# Patient Record
Sex: Male | Born: 1980 | Race: White | Hispanic: No | Marital: Single | Smoking: Never smoker
Health system: Southern US, Community
[De-identification: ages and names within clinical notes are randomized; demographics above are authoritative.]

## PROBLEM LIST (undated history)

## (undated) DIAGNOSIS — J45909 Unspecified asthma, uncomplicated: Secondary | ICD-10-CM

## (undated) DIAGNOSIS — F909 Attention-deficit hyperactivity disorder, unspecified type: Secondary | ICD-10-CM

## (undated) DIAGNOSIS — N433 Hydrocele, unspecified: Secondary | ICD-10-CM

## (undated) DIAGNOSIS — F329 Major depressive disorder, single episode, unspecified: Secondary | ICD-10-CM

## (undated) DIAGNOSIS — Z87820 Personal history of traumatic brain injury: Secondary | ICD-10-CM

## (undated) DIAGNOSIS — F32A Depression, unspecified: Secondary | ICD-10-CM

---
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
## 2009-01-02 ENCOUNTER — Encounter: Admission: RE | Admit: 2009-01-02 | Discharge: 2009-01-02 | Payer: Self-pay | Admitting: Family Medicine

## 2009-11-29 IMAGING — US US SCROTUM
1 series · 14 of 25 positions shown · non-contrast
Comparison: None

CLINICAL DATA: Enlarged right scrotum

ULTRASOUND OF SCROTUM
TECHNIQUE: Complete ultrasound examination of the testicles,
epididymis, and other scrotal structures was performed.

[Series 1: us scrotum · 0.09mm/px · 14 of 61 slices shown]
[im 1/61]
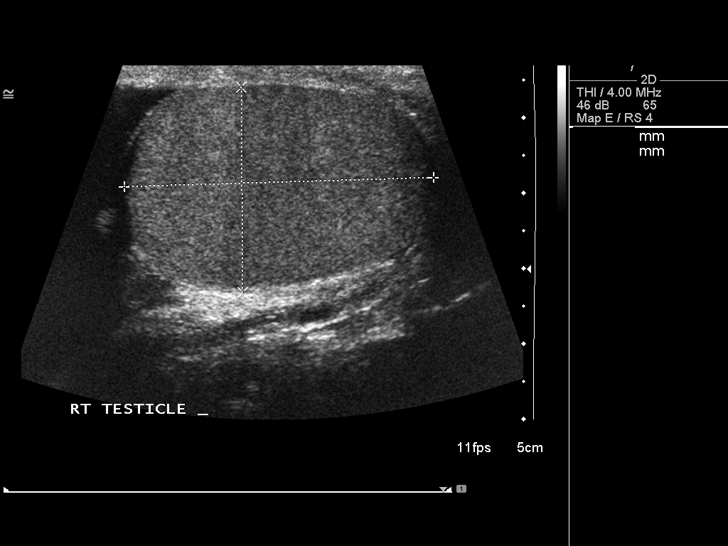
[im 6/61]
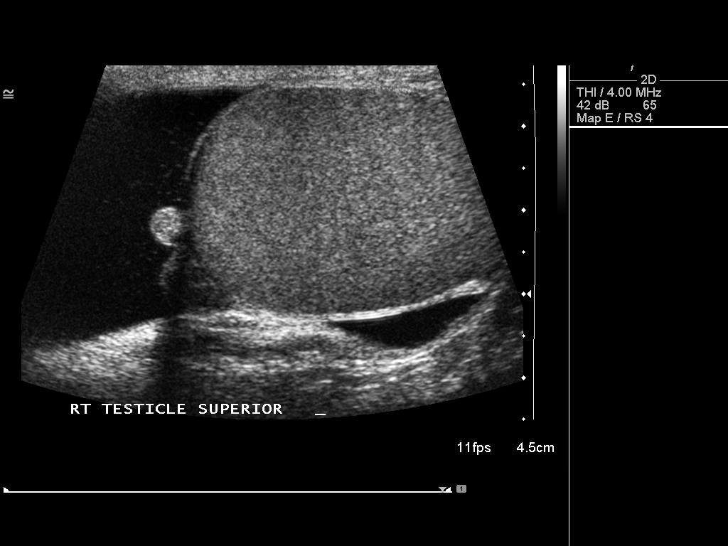
[im 11/61]
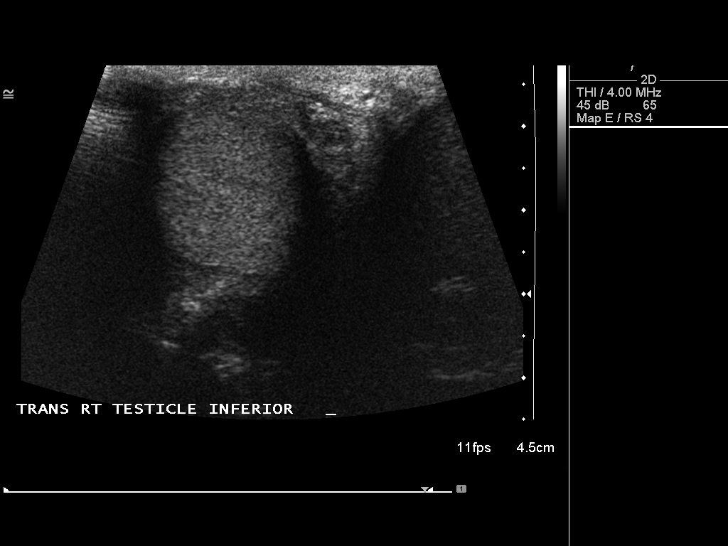
[im 16/61]
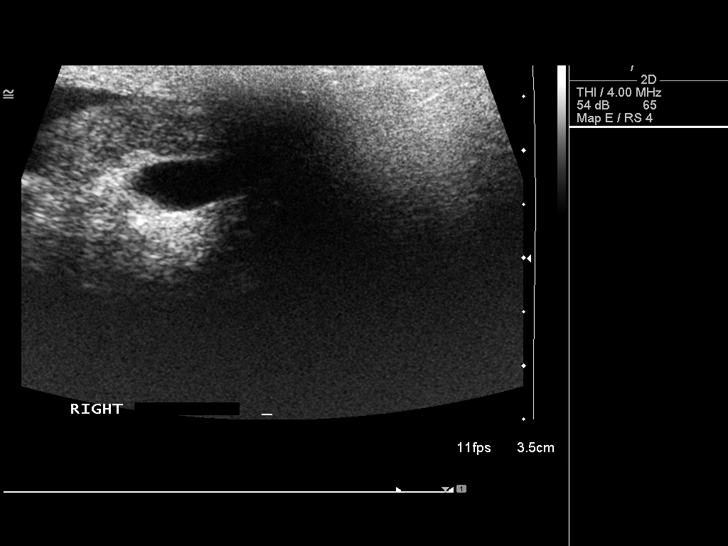
[im 21/61]
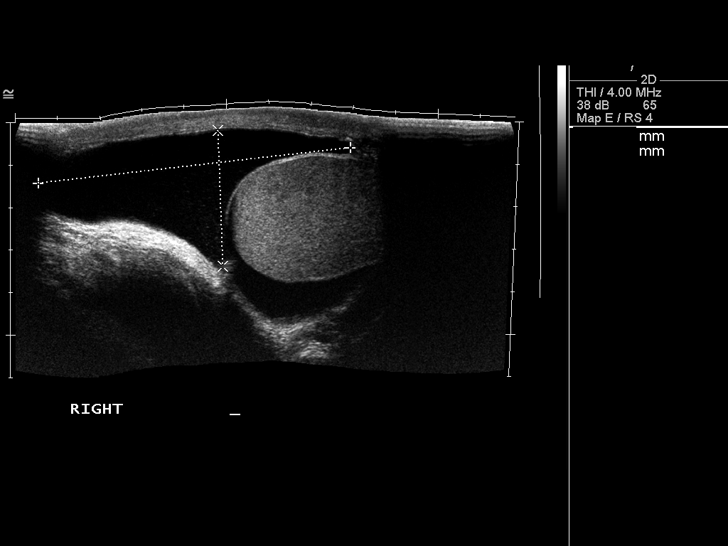
[im 23/61]
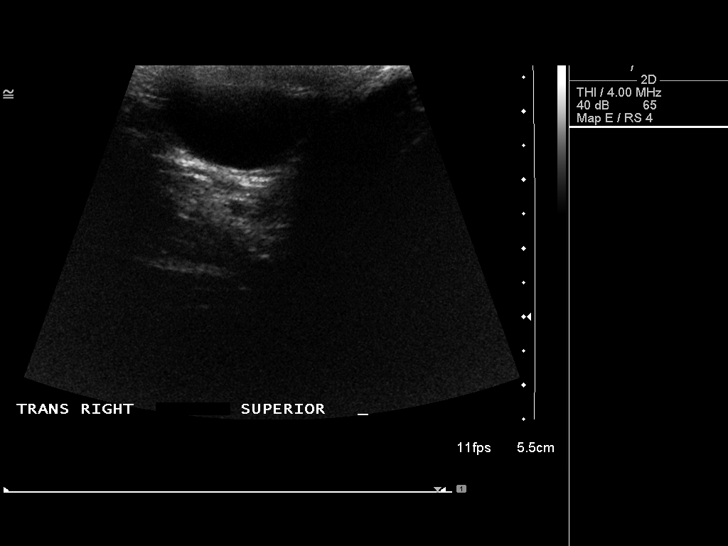
[im 28/61]
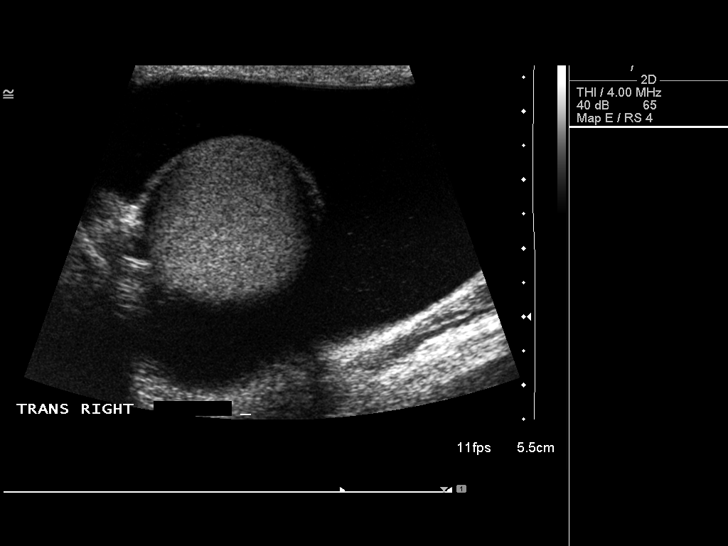
[im 33/61]
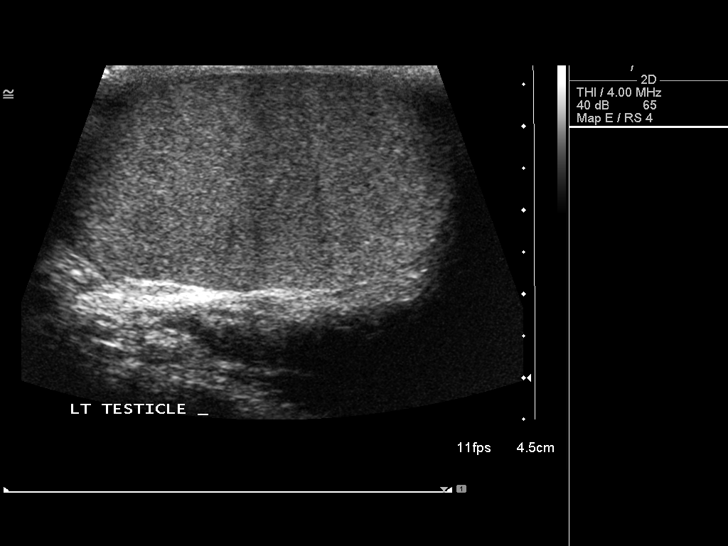
[im 38/61]
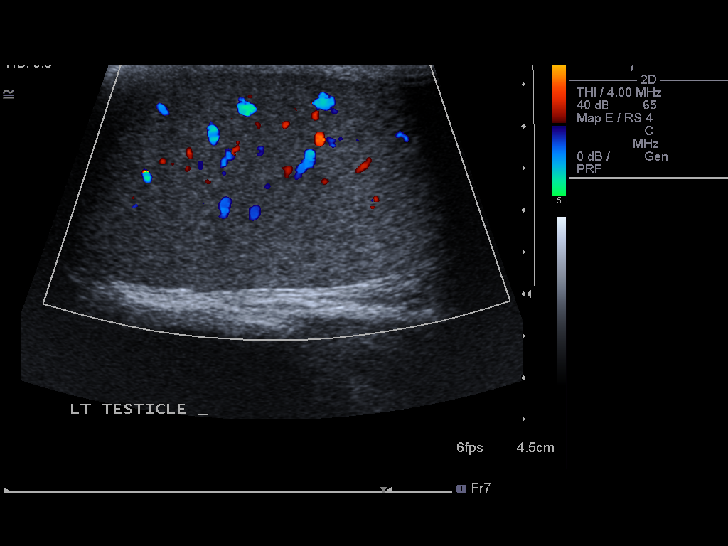
[im 41/61]
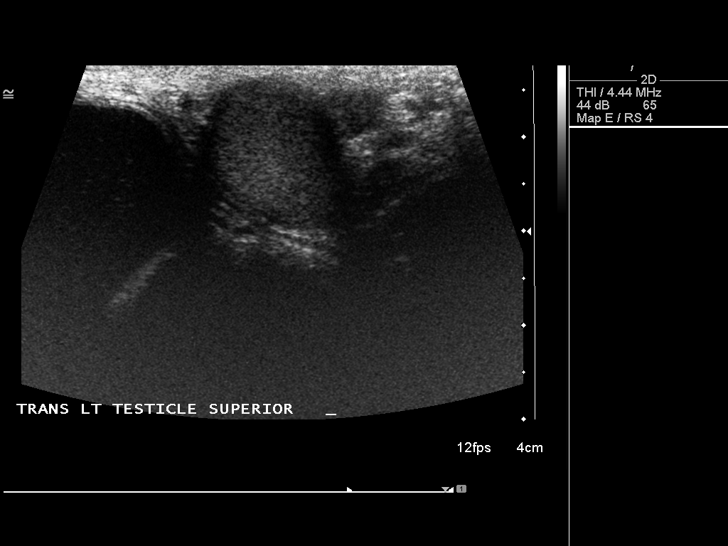
[im 46/61]
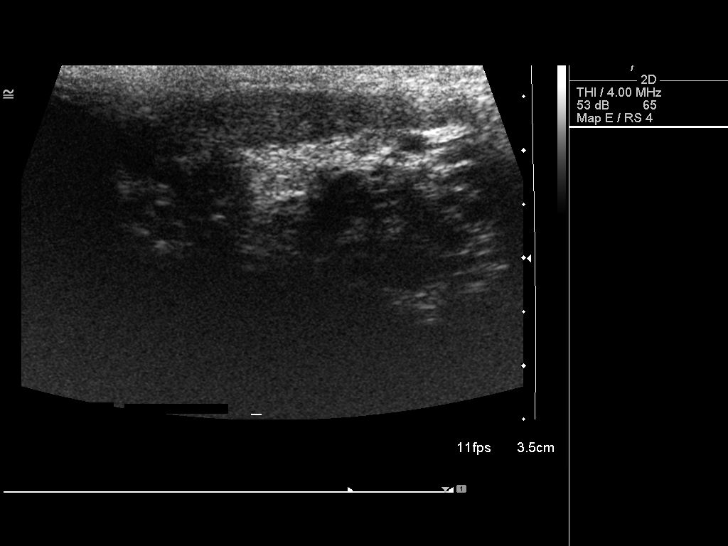
[im 51/61]
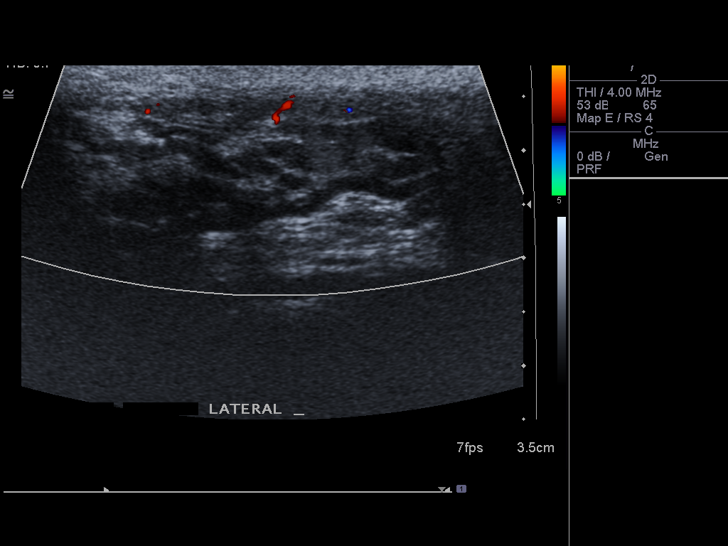
[im 56/61]
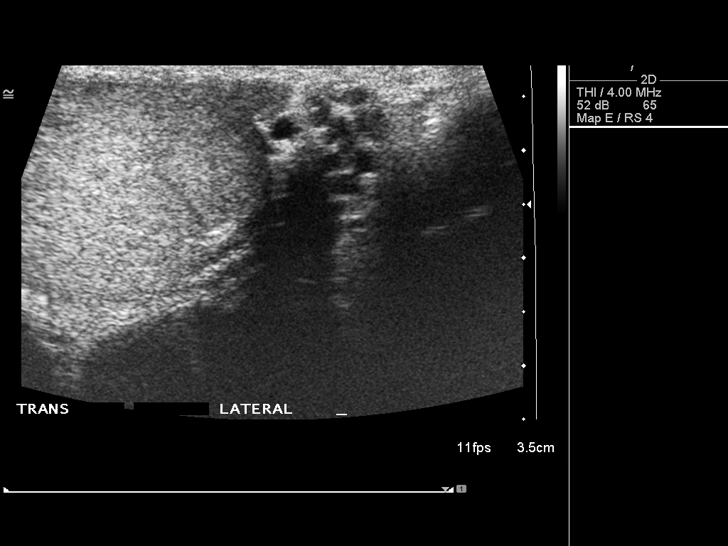
[im 61/61]
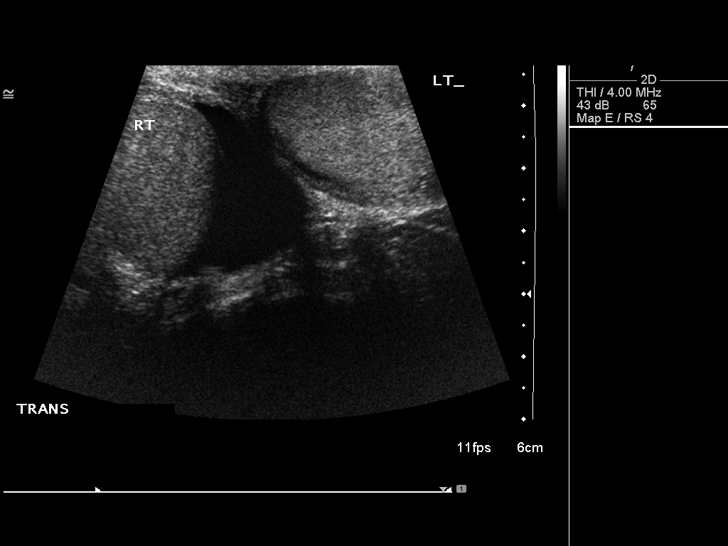

[14 of 25 positions shown; findings below may reference images not displayed]

FINDINGS: The testicles are normal in size and in echogenicity.
There is blood flow to both testicles.  The epididymis appears
normal bilaterally.  There is a moderate to large right hydrocele
present.  A small varicocele is noted on the left which does
augment with Valsalva maneuver.
IMPRESSION: 1.  Moderate to large right hydrocele.
2.  There is blood flow to both testicles.
3.  Small left varicocele.

## 2011-04-12 ENCOUNTER — Emergency Department (HOSPITAL_COMMUNITY): Payer: PRIVATE HEALTH INSURANCE

## 2011-04-12 ENCOUNTER — Emergency Department (HOSPITAL_COMMUNITY)
Admission: EM | Admit: 2011-04-12 | Discharge: 2011-04-12 | Disposition: A | Discharge status: Home / Self Care | Payer: PRIVATE HEALTH INSURANCE | Attending: Emergency Medicine | Admitting: Emergency Medicine

## 2011-04-12 DIAGNOSIS — I498 Other specified cardiac arrhythmias: Secondary | ICD-10-CM | POA: Insufficient documentation

## 2011-04-12 DIAGNOSIS — R4789 Other speech disturbances: Secondary | ICD-10-CM | POA: Insufficient documentation

## 2011-04-12 DIAGNOSIS — R55 Syncope and collapse: Secondary | ICD-10-CM | POA: Insufficient documentation

## 2011-04-12 DIAGNOSIS — J45909 Unspecified asthma, uncomplicated: Secondary | ICD-10-CM | POA: Insufficient documentation

## 2011-04-12 DIAGNOSIS — R42 Dizziness and giddiness: Secondary | ICD-10-CM | POA: Insufficient documentation

## 2011-04-12 DIAGNOSIS — R51 Headache: Secondary | ICD-10-CM | POA: Insufficient documentation

## 2011-04-12 DIAGNOSIS — F29 Unspecified psychosis not due to a substance or known physiological condition: Secondary | ICD-10-CM | POA: Insufficient documentation

## 2011-04-12 DIAGNOSIS — R5381 Other malaise: Secondary | ICD-10-CM | POA: Insufficient documentation

## 2011-04-12 DIAGNOSIS — F988 Other specified behavioral and emotional disorders with onset usually occurring in childhood and adolescence: Secondary | ICD-10-CM | POA: Insufficient documentation

## 2011-04-12 DIAGNOSIS — Z79899 Other long term (current) drug therapy: Secondary | ICD-10-CM | POA: Insufficient documentation

## 2011-04-12 DIAGNOSIS — H538 Other visual disturbances: Secondary | ICD-10-CM | POA: Insufficient documentation

## 2011-04-12 DIAGNOSIS — R209 Unspecified disturbances of skin sensation: Secondary | ICD-10-CM | POA: Insufficient documentation

## 2011-04-12 LAB — CBC
HCT: 41.8 % (ref 39.0–52.0)
Hemoglobin: 15.1 g/dL (ref 13.0–17.0)
MCH: 31.6 pg (ref 26.0–34.0)
MCHC: 36.1 g/dL — ABNORMAL HIGH (ref 30.0–36.0)
MCV: 87.4 fL (ref 78.0–100.0)
Platelets: 195 10*3/uL (ref 150–400)
RBC: 4.78 MIL/uL (ref 4.22–5.81)
RDW: 12.6 % (ref 11.5–15.5)
WBC: 7 10*3/uL (ref 4.0–10.5)

## 2011-04-12 LAB — BASIC METABOLIC PANEL
BUN: 10 mg/dL (ref 6–23)
CO2: 26 mEq/L (ref 19–32)
Calcium: 9.1 mg/dL (ref 8.4–10.5)
Chloride: 105 mEq/L (ref 96–112)
Creatinine, Ser: 0.91 mg/dL (ref 0.4–1.5)
GFR calc Af Amer: >60 mL/min (ref >60)
GFR calc non Af Amer: >60 mL/min (ref >60)
Glucose, Bld: 119 mg/dL — ABNORMAL HIGH (ref 70–99)
Potassium: 3.7 mEq/L (ref 3.5–5.1)
Sodium: 138 mEq/L (ref 135–145)

## 2011-04-12 LAB — DIFFERENTIAL
Basophils Absolute: 0 10*3/uL (ref 0.0–0.1)
Basophils Relative: 0 % (ref 0–1)
Eosinophils Absolute: 0.1 10*3/uL (ref 0.0–0.7)
Eosinophils Relative: 1 % (ref 0–5)
Lymphocytes Relative: 20 % (ref 12–46)
Lymphs Abs: 1.4 10*3/uL (ref 0.7–4.0)
Monocytes Absolute: 0.4 10*3/uL (ref 0.1–1.0)
Monocytes Relative: 6 % (ref 3–12)
Neutro Abs: 5.1 10*3/uL (ref 1.7–7.7)
Neutrophils Relative %: 73 % (ref 43–77)

## 2011-04-30 NOTE — H&P (Signed)
[facility]  Patient:    Brent Frey, Brent Frey                          MRN: [id number] Adm. Date:  [id number] Disc. Date: [id number] Attending:  Carolanne Grumbling D                   Psychiatric Admission Assessment  CHIEF COMPLAINT:  Patient was admitted to the hospital after taking an overdose of eight Advil.  His blood alcohol level was 236 with a normal high of 10 when he was in the emergency room.  PATIENT IDENTIFICATION:  Patient is an 30 year old male.  HISTORY OF PRESENT ILLNESS:  Patient, at this point, says he just needs to sleep.  He does not really recall all the details in what happened last night. He denies any current suicidal ideation.  His parents are accompanying him and said they were unaware of any particular problems that he was having, that he seemed happy.  He had a good life.  He is around them a lot, so they do see a good bit of him.  They saw him last night when he came in.  He seemed fine and they were surprised when his girlfriend called in the middle of the night saying that he said he had taken an overdose.  PAST PSYCHIATRIC HISTORY:  He has been somewhat depressed recently in that he reported to his mother that he felt depressed.  So he has been seeing Dr. Wyn Quaker for the last few weeks and seems to be fine.  There is a family history of bipolar disorder in the maternal grandfather and his mother reported she had not noticed any bipolar symptoms but he does have mood swings to some extent but not to any great degree.  SUBSTANCE ABUSE HISTORY:  He does drink on the weekends.  It is not clear how much he drinks but obviously the night of admission he had a good bit to drink.  His parents say that even with that much to drink, he was able to walk around and not seem to have slurred speech, was not staggering and looked fine to them; just sleepy.  He denies any other substances; at least currently.  PAST MEDICAL HISTORY:  He is allergic to  SULFA drugs.  He is not taking any medications.  Has no medical problems.  SOCIAL HISTORY:  He lives with his mother, father and his younger brother.  He gets along fine with them.  They have a good relationship with each other. School is good.  Grades are fine.  He plays varsity sports.  Has friends. Seems happy.  Has no history of any abuse; physical or sexual.  He has no legal problems.  MENTAL STATUS EXAMINATION:  At the time of initial evaluation, revealed a sleepy, oriented young man but said he really could not think straight.  He has been up all night and said he needed sleep.  He denied any current suicidal ideation and said he could not really say what was happening last night, though he did remember taking the overdose.  There was no evidence of any psychotic thinking.  Memory was not judgeable because of his unwillingness to talk and answer questions.  He is, by history, at least average intelligence.  He has a history of poor concentration.  ADMISSION DIAGNOSES: Axis I:    1. Depressive disorder not otherwise specified.  2. Alcohol abuse.            3. Attention-deficit disorder. Axis II:   Deferred. Axis III:  Healthy. Axis IV:   Mild. Axis V:    45/85.  ASSETS AND STRENGTHS:  Patient is intelligent.  He has a supportive family.  INITIAL PLAN OF CARE:  See what he is like when he is alert and sober and then decide what is indicated from that point on.  ESTIMATED LENGTH OF STAY:  One to three days. DD:  04/24/00 TD:  04/26/00 Job: [id number]

## 2011-04-30 NOTE — Discharge Summary (Signed)
[facility]  Patient:    Brent Frey, Brent Frey                          MRN: [id number] Adm. Date:  [id number] Disc. Date: [id number] Attending:  Carolanne Grumbling D                           Discharge Summary  IDENTIFICATION:  Brent Frey was an 30 year old male.  INITIAL ASSESSMENT AND DIAGNOSIS:  Brent Frey was admitted to the hospital after taking an overdose of eight Advil tablets.  His blood alcohol level was 236 with a normal high of 10.  At the time I saw him he said just needed to sleep. He did not recall the details of what happened the night before.  He denied any current suicidal ideation.  His parents who were with him said they were unaware of any particular problems that he was having.  He seemed fine even at night.  He did not seem drunk to them when he came home.  They were surprised that in the middle of the night his girlfriend called and told them of the overdose.  MENTAL STATUS:  Mental status at the time of the initial evaluation revealed a sleepy young man who did not want to talk.  He basically denied any suicidal ideation.  He said all he wanted to do was go to sleep.  He was making no threats towards anyone else.  By history he is at least average intelligence. He had a history of poor concentration.  Memory was not measurable because of his difficulty cooperating.  No further history was obtained by the psychosocial service summary because of his short stay.  PHYSICAL EXAMINATION:  Noncontributory.  ADMITTING DIAGNOSES: Axis I:    1. Depressive disorder, not otherwise specified.            2. Alcohol abuse.            3. Attention deficit disorder. Axis II:   Deferred. Axis III:  Healthy. Axis IV:   Mild. Axis V:    45/85.  LABORATORY FINDINGS:  All indicated laboratory examinations were done prior to his admission via the emergency service.  HOSPITAL COURSE:  While in the hospital Brent Frey was no behavioral problem.  Once he had a nights sleep he  was very appropriate.  He was feeling sheepish about taking the overdose.  He said there is really no thing in his life at the moment that is a problem.  School is going well.  His friends and relationships are going okay.  He has a girlfriend and things are good there. His relationship with his parents is good.  He is looking forward to summer. He says all the things he had done to get himself in trouble like this are done when he is drinking.  He does drink to the point where he cannot remember what happened the day before at times and once he starts drinking sometimes, he has trouble stopping.  Consequently, his alcoholism seemed to be the major issue at the moment, and his parents in meeting with him decided that he would continue in outpatient therapy talking about his issues, but if he could not get the drinking under control, or stop it completely which was preferable, they would look for a drug rehab for him.  CONDITION AT THE TIME OF DISCHARGE:  He was denying any threats towards himself  or anyone else.  His parents were comfortable having him home and he was consequently discharged home.  POST HOSPITAL CARE PLANS:  He will see Dr. Eliott Nine on May 15 and he will see me on June 11.  At the time of discharge he was taking _________ 20 mg twice daily.  ACTIVITY/DIET:  No restrictions were placed on his activity or his diet.  FINAL DIAGNOSES: Axis I:    1. Depressive disorder, not otherwise specified.            2. Alcohol abuse.            3. Attention deficit disorder. Axis II:   No diagnosis. Axis III:  Healthy. Axis IV:   Mild. Axis V:    50.DD:  05/04/00 TD:  05/04/00 Job: [id number]

## 2013-10-16 ENCOUNTER — Other Ambulatory Visit: Payer: Self-pay | Admitting: Urology

## 2013-11-16 ENCOUNTER — Encounter (HOSPITAL_BASED_OUTPATIENT_CLINIC_OR_DEPARTMENT_OTHER): Payer: Self-pay | Admitting: *Deleted

## 2013-11-16 NOTE — Progress Notes (Signed)
NPO AFTER MN. ARRIVE AT 0930. NEEDS HG. WILL TAKE SINGULAIR AND CLARITIN , DO ADVAIR INHALER AM DOS W/ SIPS OF WATER.

## 2013-11-19 NOTE — H&P (Signed)
History of Present Illness   Brent Frey has stable frequency as noted.  He has a right-sided hydrocele. He had a scrotal ultrasound to make certain the right testicle was normal. The right testicle is 3.81 cm x 2.9 cm x 3.29 cm. Left testicle was 4.2 cm x 2.4 cm x 3.2 cm. Epididymis was normal. He had a hydrocele on the right, approximately 8 x 3 cm. Blood supply was good bilaterally. There was intratesticular masses. The testicles are homogenous.  Review of systems: No change in bowel or neurologic systems.    Past Medical History Problems  1. History of Anxiety (300.00) 2. History of Asthma (493.90) 3. History of Asthma (493.90)  Surgical History Problems  1. History of No Surgical Problems 2. History of No Surgical Problems  Current Meds 1. Adderall TABS;  Therapy: (Recorded:28Apr2010) to Recorded 2. Advair Diskus MISC;  Therapy: (Recorded:28Apr2010) to Recorded 3. Flexeril 10 MG TABS;  Therapy: (Recorded:13Oct2014) to Recorded 4. KlonoPIN 1 MG Oral Tablet;  Therapy: (Recorded:13Oct2014) to Recorded 5. Singulair TABS;  Therapy: (Recorded:28Apr2010) to Recorded  Allergies Medication  1. Sulfa Drugs  Social History Problems  1. Alcohol Use   2 drinks daily 2. Caffeine Use   0-1 drinks daily 3. Marital History - Single 4. Never A Smoker 5. Occupation:   Airline pilot 6. Denied: History of Tobacco Use  Results/Data  Urine [Data Includes: Last 1 Day]   29Oct2014  COLOR STRAW   APPEARANCE CLEAR   SPECIFIC GRAVITY <1.005   pH 5.5   GLUCOSE NEG mg/dL  BILIRUBIN NEG   KETONE NEG mg/dL  BLOOD SMALL   PROTEIN NEG mg/dL  UROBILINOGEN 0.2 mg/dL  NITRITE NEG   LEUKOCYTE ESTERASE NEG   SQUAMOUS EPITHELIAL/HPF NONE SEEN   WBC 0-2 WBC/hpf  RBC 0-2 RBC/hpf  BACTERIA NONE SEEN   CRYSTALS NONE SEEN   CASTS NONE SEEN    Assessment Assessed  1. Hydrocele (603.9) 2. Increased urinary frequency (788.41)  End of Encounter Meds  Medication Name Instruction  Adderall  TABS (Amphetamine-Dextroamphetamine)   Advair Diskus MISC   Flexeril 10 MG TABS (Cyclobenzaprine HCl)   KlonoPIN 1 MG Oral Tablet (ClonazePAM)   Singulair TABS (Montelukast Sodium)    Plan Hydrocele  1. Follow-up Schedule Surgery Office  Follow-up  Status: Complete  Done: 29Oct2014  Discussion/Summary   Brent Frey would like to proceed with a right hydrocelectomy. All questions answered.  After a thorough review of the management options for the patient's condition the patient  elected to proceed with surgical therapy as noted above. We have discussed the potential benefits and risks of the procedure, side effects of the proposed treatment, the likelihood of the patient achieving the goals of the procedure, and any potential problems that might occur during the procedure or recuperation. Informed consent has been obtained.

## 2013-11-20 ENCOUNTER — Ambulatory Visit (HOSPITAL_BASED_OUTPATIENT_CLINIC_OR_DEPARTMENT_OTHER): Payer: BC Managed Care – PPO | Admitting: Anesthesiology

## 2013-11-20 ENCOUNTER — Encounter (HOSPITAL_BASED_OUTPATIENT_CLINIC_OR_DEPARTMENT_OTHER): Payer: BC Managed Care – PPO | Admitting: Anesthesiology

## 2013-11-20 ENCOUNTER — Encounter (HOSPITAL_BASED_OUTPATIENT_CLINIC_OR_DEPARTMENT_OTHER): Payer: Self-pay | Admitting: Anesthesiology

## 2013-11-20 ENCOUNTER — Encounter (HOSPITAL_BASED_OUTPATIENT_CLINIC_OR_DEPARTMENT_OTHER)
Admission: RE | Disposition: A | Discharge status: Home / Self Care | Payer: Self-pay | Source: Ambulatory Visit | Attending: Urology

## 2013-11-20 ENCOUNTER — Ambulatory Visit (HOSPITAL_BASED_OUTPATIENT_CLINIC_OR_DEPARTMENT_OTHER)
Admission: RE | Admit: 2013-11-20 | Discharge: 2013-11-20 | Disposition: A | Discharge status: Home / Self Care | Payer: BC Managed Care – PPO | Source: Ambulatory Visit | Attending: Urology | Admitting: Urology

## 2013-11-20 DIAGNOSIS — J45909 Unspecified asthma, uncomplicated: Secondary | ICD-10-CM | POA: Insufficient documentation

## 2013-11-20 DIAGNOSIS — Z79899 Other long term (current) drug therapy: Secondary | ICD-10-CM | POA: Insufficient documentation

## 2013-11-20 DIAGNOSIS — N433 Hydrocele, unspecified: Secondary | ICD-10-CM | POA: Insufficient documentation

## 2013-11-20 HISTORY — DX: Depression, unspecified: F32.A

## 2013-11-20 HISTORY — DX: Attention-deficit hyperactivity disorder, unspecified type: F90.9

## 2013-11-20 HISTORY — PX: HYDROCELE EXCISION: SHX482

## 2013-11-20 HISTORY — DX: Major depressive disorder, single episode, unspecified: F32.9

## 2013-11-20 HISTORY — DX: Personal history of traumatic brain injury: Z87.820

## 2013-11-20 HISTORY — DX: Unspecified asthma, uncomplicated: J45.909

## 2013-11-20 HISTORY — DX: Hydrocele, unspecified: N43.3

## 2013-11-20 LAB — POCT HEMOGLOBIN-HEMACUE: Hemoglobin: 14 g/dL (ref 13.0–17.0)

## 2013-11-20 SURGERY — HYDROCELECTOMY
Anesthesia: General | Site: Scrotum | Laterality: Right

## 2013-11-20 MED ORDER — LACTATED RINGERS IV SOLN
INTRAVENOUS | Status: DC
  Filled 2013-11-20: qty 1000

## 2013-11-20 MED ORDER — FENTANYL CITRATE 0.05 MG/ML IJ SOLN
INTRAMUSCULAR | Status: DC | PRN
  Administered 2013-11-20: 25 ug via INTRAVENOUS
  Administered 2013-11-20 (×5): 12.5 ug via INTRAVENOUS
  Administered 2013-11-20 (×2): 25 ug via INTRAVENOUS
  Administered 2013-11-20 (×5): 12.5 ug via INTRAVENOUS

## 2013-11-20 MED ORDER — MIDAZOLAM HCL 5 MG/5ML IJ SOLN
INTRAMUSCULAR | Status: DC | PRN
  Administered 2013-11-20 (×2): 1 mg via INTRAVENOUS

## 2013-11-20 MED ORDER — DEXAMETHASONE SODIUM PHOSPHATE 4 MG/ML IJ SOLN
INTRAMUSCULAR | Status: DC | PRN
  Administered 2013-11-20: 10 mg via INTRAVENOUS

## 2013-11-20 MED ORDER — LACTATED RINGERS IV SOLN
INTRAVENOUS | Status: DC | PRN
  Administered 2013-11-20 (×2): via INTRAVENOUS

## 2013-11-20 MED ORDER — CEPHALEXIN 250 MG PO CAPS: 250.0000 mg | ORAL_CAPSULE | Freq: Three times a day (TID) | ORAL | Status: AC

## 2013-11-20 MED ORDER — PROMETHAZINE HCL 25 MG/ML IJ SOLN
6.2500 mg | INTRAMUSCULAR | Status: DC | PRN
  Filled 2013-11-20: qty 1

## 2013-11-20 MED ORDER — ONDANSETRON HCL 4 MG/2ML IJ SOLN
INTRAMUSCULAR | Status: DC | PRN
  Administered 2013-11-20: 4 mg via INTRAVENOUS

## 2013-11-20 MED ORDER — CEFAZOLIN SODIUM-DEXTROSE 2-3 GM-% IV SOLR
INTRAVENOUS | Status: DC | PRN
  Administered 2013-11-20: 2 g via INTRAVENOUS

## 2013-11-20 MED ORDER — MIDAZOLAM HCL 2 MG/2ML IJ SOLN
INTRAMUSCULAR | Status: AC
  Filled 2013-11-20: qty 2

## 2013-11-20 MED ORDER — KETOROLAC TROMETHAMINE 30 MG/ML IJ SOLN
INTRAMUSCULAR | Status: DC | PRN
  Administered 2013-11-20: 30 mg via INTRAVENOUS

## 2013-11-20 MED ORDER — BUPIVACAINE HCL (PF) 0.25 % IJ SOLN
INTRAMUSCULAR | Status: DC | PRN
  Administered 2013-11-20: 2 mL

## 2013-11-20 MED ORDER — OXYCODONE-ACETAMINOPHEN 10-650 MG PO TABS: 1.0000 | ORAL_TABLET | Freq: Four times a day (QID) | ORAL | Status: AC | PRN

## 2013-11-20 MED ORDER — FENTANYL CITRATE 0.05 MG/ML IJ SOLN
INTRAMUSCULAR | Status: AC
  Filled 2013-11-20: qty 4

## 2013-11-20 MED ORDER — OXYCODONE-ACETAMINOPHEN 5-325 MG PO TABS
1.0000 | ORAL_TABLET | ORAL | Status: DC | PRN
  Administered 2013-11-20: 1 via ORAL
  Filled 2013-11-20: qty 1

## 2013-11-20 MED ORDER — HYDROMORPHONE HCL PF 1 MG/ML IJ SOLN
0.2500 mg | INTRAMUSCULAR | Status: DC | PRN
  Filled 2013-11-20: qty 1

## 2013-11-20 MED ORDER — LACTATED RINGERS IV SOLN
INTRAVENOUS | Status: DC
  Administered 2013-11-20: 10:00:00 via INTRAVENOUS
  Filled 2013-11-20: qty 1000

## 2013-11-20 MED ORDER — PROPOFOL 10 MG/ML IV BOLUS
INTRAVENOUS | Status: DC | PRN
  Administered 2013-11-20: 200 mg via INTRAVENOUS

## 2013-11-20 MED ORDER — CEFAZOLIN SODIUM 1-5 GM-% IV SOLN
1.0000 g | INTRAVENOUS | Status: DC
  Filled 2013-11-20: qty 50

## 2013-11-20 MED ORDER — OXYCODONE-ACETAMINOPHEN 5-325 MG PO TABS
ORAL_TABLET | ORAL | Status: AC
  Filled 2013-11-20: qty 1

## 2013-11-20 MED ORDER — LIDOCAINE HCL (CARDIAC) 20 MG/ML IV SOLN
INTRAVENOUS | Status: DC | PRN
  Administered 2013-11-20: 100 mg via INTRAVENOUS

## 2013-11-20 SURGICAL SUPPLY — 42 items
ADH SKN CLS APL DERMABOND .7 (GAUZE/BANDAGES/DRESSINGS) ×1
APPLICATOR COTTON TIP 6IN STRL (MISCELLANEOUS) ×2 IMPLANT
BANDAGE GAUZE ELAST BULKY 4 IN (GAUZE/BANDAGES/DRESSINGS) ×2 IMPLANT
BLADE SURG 15 STRL LF DISP TIS (BLADE) ×1 IMPLANT
BLADE SURG 15 STRL SS (BLADE) ×2
BLADE SURG ROTATE 9660 (MISCELLANEOUS) ×2 IMPLANT
BRIEF STRETCH FOR OB PAD LRG (UNDERPADS AND DIAPERS) ×1 IMPLANT
CANISTER SUCTION 1200CC (MISCELLANEOUS) IMPLANT
CANISTER SUCTION 2500CC (MISCELLANEOUS) IMPLANT
CLOTH BEACON ORANGE TIMEOUT ST (SAFETY) ×2 IMPLANT
COVER MAYO STAND STRL (DRAPES) ×2 IMPLANT
COVER TABLE BACK 60X90 (DRAPES) ×2 IMPLANT
DERMABOND ADVANCED (GAUZE/BANDAGES/DRESSINGS) ×1
DERMABOND ADVANCED .7 DNX12 (GAUZE/BANDAGES/DRESSINGS) IMPLANT
DISSECTOR ROUND CHERRY 3/8 STR (MISCELLANEOUS) IMPLANT
DRAIN PENROSE 18X1/4 LTX STRL (WOUND CARE) IMPLANT
DRAPE PED LAPAROTOMY (DRAPES) ×2 IMPLANT
DRESSING TELFA 8X3 (GAUZE/BANDAGES/DRESSINGS) ×2 IMPLANT
ELECT NDL TIP 2.8 STRL (NEEDLE) ×1 IMPLANT
ELECT NEEDLE TIP 2.8 STRL (NEEDLE) ×2 IMPLANT
ELECT REM PT RETURN 9FT ADLT (ELECTROSURGICAL) ×2
ELECTRODE REM PT RTRN 9FT ADLT (ELECTROSURGICAL) ×1 IMPLANT
GLOVE BIO SURGEON STRL SZ7.5 (GLOVE) ×2 IMPLANT
GLOVE BIOGEL PI IND STRL 7.5 (GLOVE) IMPLANT
GLOVE BIOGEL PI INDICATOR 7.5 (GLOVE) ×2
GOWN W/COTTON TOWEL STD LRG (GOWNS) ×1 IMPLANT
GOWN XL W/COTTON TOWEL STD (GOWNS) ×3 IMPLANT
NEEDLE HYPO 25X1 1.5 SAFETY (NEEDLE) ×2 IMPLANT
NS IRRIG 500ML POUR BTL (IV SOLUTION) ×2 IMPLANT
PACK BASIN DAY SURGERY FS (CUSTOM PROCEDURE TRAY) ×2 IMPLANT
PAD PREP 24X48 CUFFED NSTRL (MISCELLANEOUS) ×2 IMPLANT
PENCIL BUTTON HOLSTER BLD 10FT (ELECTRODE) ×2 IMPLANT
SUPPORT SCROTAL LG STRP (MISCELLANEOUS) ×1 IMPLANT
SUT CHROMIC 3 0 SH 27 (SUTURE) ×7 IMPLANT
SUT MNCRL AB 3-0 PS2 18 (SUTURE) ×1 IMPLANT
SUT SILK 2 0 SH (SUTURE) IMPLANT
SUT VICRYL 4-0 PS2 18IN ABS (SUTURE) ×1 IMPLANT
SYR CONTROL 10ML LL (SYRINGE) ×2 IMPLANT
TOWEL OR 17X24 6PK STRL BLUE (TOWEL DISPOSABLE) ×4 IMPLANT
TRAY DSU PREP LF (CUSTOM PROCEDURE TRAY) ×2 IMPLANT
WATER STERILE IRR 500ML POUR (IV SOLUTION) ×1 IMPLANT
YANKAUER SUCT BULB TIP NO VENT (SUCTIONS) ×2 IMPLANT

## 2013-11-20 NOTE — Anesthesia Procedure Notes (Addendum)
Procedure Name: LMA Insertion Date/Time: 11/20/2013 10:57 AM Performed by: Jessica Priest Pre-anesthesia Checklist: Patient identified, Emergency Drugs available, Suction available and Patient being monitored Patient Re-evaluated:Patient Re-evaluated prior to inductionOxygen Delivery Method: Circle System Utilized Preoxygenation: Pre-oxygenation with 100% oxygen Intubation Type: IV induction Ventilation: Mask ventilation without difficulty LMA: LMA inserted LMA Size: 4.0 Number of attempts: 1 Airway Equipment and Method: bite block Placement Confirmation: positive ETCO2 Tube secured with: Tape Dental Injury: Teeth and Oropharynx as per pre-operative assessment

## 2013-11-20 NOTE — Transfer of Care (Signed)
Immediate Anesthesia Transfer of Care Note  Patient: Brent Frey  Procedure(s) Performed: Procedure(s) (LRB): REPAIR RIGHT HYDROCELE (Right)  Patient Location: PACU  Anesthesia Type: General  Level of Consciousness: awake, sedated, patient cooperative and responds to stimulation  Airway & Oxygen Therapy: Patient Spontanous Breathing and Patient connected to face mask oxygen  Post-op Assessment: Report given to PACU RN, Post -op Vital signs reviewed and stable and Patient moving all extremities  Post vital signs: Reviewed and stable  Complications: No apparent anesthesia complications

## 2013-11-20 NOTE — Op Note (Signed)
Preoperative diagnosis: Right hydrocele Postoperative diagnosis: Right hydrocele Surgery: Repair of right hydrocele Surgeon: Dr. Lorin Picket Neveyah Garzon  The patient has the above diagnoses and consented to the above procedure. Extra care was taken with skin preparation and shaving. Right arm was marked. Preoperative antibiotics were given.  After prepping the patient a 4 cm mid scrotal incision on the right side in a transverse orientation was marked. The nurse helped with holding of the hydrocele with upward pressure. The incision lengthened relatively. With a scalpel blade I incised the skin and went through the thin dartos fascia. I mobilized the dartos fascia with sharp dissection easily. I easily delivered the hydrocele. With a moist Ray-Tec I swept soft tissue away from the hydrocele.  Away from the testicle the hydrocele was opened 2 cm and emptied. It was then opened widely. There was almost no bleeding or inflammation of the opened Hydrocele or other soft tissue. The hydrocele wall was placed around the back of the cord with instruments placing it in appropriate position. I did a loose r-eapproximation of the hydrocele sac. It went very well. There wss no tension on the cord or compression on the cord. There was no bleeding. Orientation of the testicle was always insured.  Careful inspection for bleeding was performed. Tanja Port was placed on the dartos fascia and skin at the incision apices bilaterally. Testicle was placed in right hemiscrotum. The lie was excellent. One superficial 3-0 chromic was used at 6:00 to tack the testicle to the soft tissue. Again there was no bleeding. Irrigation was utilized.  Between the 2 [place] I closed the dartos with running 3-0 chromic suture. I then closed the skin with interrupted 3-0 chromic. I did easily palpate a soft testicle on both sides. Lie of testicle was normal bilaterally. Dermabond and pressure dressing was applied. The nurses pointed out two blanched  less than 1 cm scrotal areas away from the closed incision that in my opinion were normal findings and not due to any cauterization or trauma during skin preparation or surgery.  I was very pleased with the surgery and hopefully it reaches the patient's treatment goal

## 2013-11-20 NOTE — Anesthesia Postprocedure Evaluation (Signed)
Anesthesia Post Note  Patient: Brent Frey  Procedure(s) Performed: Procedure(s) (LRB): REPAIR RIGHT HYDROCELE (Right)  Anesthesia type: General  Patient location: PACU  Post pain: Pain level controlled  Post assessment: Post-op Vital signs reviewed  Last Vitals:  Filed Vitals:   11/20/13 1200  BP: 129/63  Pulse: 76  Temp:   Resp: 17    Post vital signs: Reviewed  Level of consciousness: sedated  Complications: No apparent anesthesia complications

## 2013-11-20 NOTE — Interval H&P Note (Signed)
History and Physical Interval Note:  11/20/2013 7:12 AM  Brent Frey  has presented today for surgery, with the diagnosis of RIGHT HYDROCELE   The various methods of treatment have been discussed with the patient and family. After consideration of risks, benefits and other options for treatment, the patient has consented to  Procedure(s): REPAIR RIGHT HYDROCELE (Right) as a surgical intervention .  The patient's history has been reviewed, patient examined, no change in status, stable for surgery.  I have reviewed the patient's chart and labs.  Questions were answered to the patient's satisfaction.     Jeananne Bedwell A

## 2013-11-20 NOTE — Anesthesia Preprocedure Evaluation (Addendum)
Anesthesia Evaluation  Patient identified by MRN, date of birth, ID band Patient awake    Reviewed: Allergy & Precautions, H&P , NPO status , Patient's Chart, lab work & pertinent test results  Airway Mallampati: II TM Distance: >3 FB Neck ROM: Full    Dental  (+) Teeth Intact and Dental Advisory Given   Pulmonary asthma ,  breath sounds clear to auscultation  Pulmonary exam normal       Cardiovascular negative cardio ROS  Rhythm:Regular Rate:Normal     Neuro/Psych Depression negative neurological ROS     GI/Hepatic negative GI ROS, Neg liver ROS,   Endo/Other  negative endocrine ROS  Renal/GU negative Renal ROS  negative genitourinary   Musculoskeletal negative musculoskeletal ROS (+)   Abdominal   Peds  Hematology negative hematology ROS (+)   Anesthesia Other Findings   Reproductive/Obstetrics negative OB ROS                          Anesthesia Physical Anesthesia Plan  ASA: I  Anesthesia Plan: General   Post-op Pain Management:    Induction: Intravenous  Airway Management Planned: LMA  Additional Equipment:   Intra-op Plan:   Post-operative Plan: Extubation in OR  Informed Consent: I have reviewed the patients History and Physical, chart, labs and discussed the procedure including the risks, benefits and alternatives for the proposed anesthesia with the patient or authorized representative who has indicated his/her understanding and acceptance.   Dental advisory given  Plan Discussed with: CRNA  Anesthesia Plan Comments:        Anesthesia Quick Evaluation

## 2013-11-21 ENCOUNTER — Encounter (HOSPITAL_BASED_OUTPATIENT_CLINIC_OR_DEPARTMENT_OTHER): Payer: Self-pay | Admitting: Urology

## 2014-01-10 ENCOUNTER — Other Ambulatory Visit (HOSPITAL_COMMUNITY): Payer: Self-pay | Admitting: Orthopedic Surgery

## 2014-01-10 DIAGNOSIS — M25562 Pain in left knee: Secondary | ICD-10-CM

## 2014-01-14 ENCOUNTER — Other Ambulatory Visit: Payer: BC Managed Care – PPO

## 2016-06-16 DIAGNOSIS — Z0000 Encounter for general adult medical examination without abnormal findings: Secondary | ICD-10-CM | POA: Diagnosis not present

## 2016-06-16 DIAGNOSIS — Z131 Encounter for screening for diabetes mellitus: Secondary | ICD-10-CM | POA: Diagnosis not present

## 2016-06-16 DIAGNOSIS — Z13220 Encounter for screening for lipoid disorders: Secondary | ICD-10-CM | POA: Diagnosis not present

## 2016-06-16 DIAGNOSIS — F900 Attention-deficit hyperactivity disorder, predominantly inattentive type: Secondary | ICD-10-CM | POA: Diagnosis not present

## 2016-06-16 DIAGNOSIS — J4530 Mild persistent asthma, uncomplicated: Secondary | ICD-10-CM | POA: Diagnosis not present

## 2016-09-17 DIAGNOSIS — Z23 Encounter for immunization: Secondary | ICD-10-CM | POA: Diagnosis not present

## 2016-12-17 DIAGNOSIS — F900 Attention-deficit hyperactivity disorder, predominantly inattentive type: Secondary | ICD-10-CM | POA: Diagnosis not present

## 2016-12-17 DIAGNOSIS — J4530 Mild persistent asthma, uncomplicated: Secondary | ICD-10-CM | POA: Diagnosis not present

## 2016-12-17 DIAGNOSIS — M79644 Pain in right finger(s): Secondary | ICD-10-CM | POA: Diagnosis not present

## 2017-03-01 DIAGNOSIS — J069 Acute upper respiratory infection, unspecified: Secondary | ICD-10-CM | POA: Diagnosis not present

## 2017-05-11 DIAGNOSIS — L858 Other specified epidermal thickening: Secondary | ICD-10-CM | POA: Diagnosis not present

## 2017-05-11 DIAGNOSIS — L309 Dermatitis, unspecified: Secondary | ICD-10-CM | POA: Diagnosis not present

## 2017-06-28 DIAGNOSIS — J4530 Mild persistent asthma, uncomplicated: Secondary | ICD-10-CM | POA: Diagnosis not present

## 2017-06-28 DIAGNOSIS — F900 Attention-deficit hyperactivity disorder, predominantly inattentive type: Secondary | ICD-10-CM | POA: Diagnosis not present

## 2017-11-01 DIAGNOSIS — L308 Other specified dermatitis: Secondary | ICD-10-CM | POA: Diagnosis not present

## 2017-11-01 DIAGNOSIS — Z23 Encounter for immunization: Secondary | ICD-10-CM | POA: Diagnosis not present

## 2017-12-29 DIAGNOSIS — F900 Attention-deficit hyperactivity disorder, predominantly inattentive type: Secondary | ICD-10-CM | POA: Diagnosis not present

## 2018-01-26 DIAGNOSIS — H5203 Hypermetropia, bilateral: Secondary | ICD-10-CM | POA: Diagnosis not present

## 2018-03-21 DIAGNOSIS — C44619 Basal cell carcinoma of skin of left upper limb, including shoulder: Secondary | ICD-10-CM | POA: Diagnosis not present

## 2018-03-21 DIAGNOSIS — D485 Neoplasm of uncertain behavior of skin: Secondary | ICD-10-CM | POA: Diagnosis not present

## 2018-04-20 DIAGNOSIS — C44619 Basal cell carcinoma of skin of left upper limb, including shoulder: Secondary | ICD-10-CM | POA: Diagnosis not present

## 2018-06-29 DIAGNOSIS — F900 Attention-deficit hyperactivity disorder, predominantly inattentive type: Secondary | ICD-10-CM | POA: Diagnosis not present

## 2018-06-29 DIAGNOSIS — J301 Allergic rhinitis due to pollen: Secondary | ICD-10-CM | POA: Diagnosis not present

## 2018-06-29 DIAGNOSIS — J4530 Mild persistent asthma, uncomplicated: Secondary | ICD-10-CM | POA: Diagnosis not present

## 2018-10-31 DIAGNOSIS — L821 Other seborrheic keratosis: Secondary | ICD-10-CM | POA: Diagnosis not present

## 2018-10-31 DIAGNOSIS — Z85828 Personal history of other malignant neoplasm of skin: Secondary | ICD-10-CM | POA: Diagnosis not present

## 2018-10-31 DIAGNOSIS — D1801 Hemangioma of skin and subcutaneous tissue: Secondary | ICD-10-CM | POA: Diagnosis not present

## 2018-10-31 DIAGNOSIS — L814 Other melanin hyperpigmentation: Secondary | ICD-10-CM | POA: Diagnosis not present

## 2018-11-15 DIAGNOSIS — J4530 Mild persistent asthma, uncomplicated: Secondary | ICD-10-CM | POA: Diagnosis not present

## 2018-11-15 DIAGNOSIS — F900 Attention-deficit hyperactivity disorder, predominantly inattentive type: Secondary | ICD-10-CM | POA: Diagnosis not present

## 2018-11-15 DIAGNOSIS — J301 Allergic rhinitis due to pollen: Secondary | ICD-10-CM | POA: Diagnosis not present

## 2019-06-04 DIAGNOSIS — J4530 Mild persistent asthma, uncomplicated: Secondary | ICD-10-CM | POA: Diagnosis not present

## 2019-06-04 DIAGNOSIS — F4010 Social phobia, unspecified: Secondary | ICD-10-CM | POA: Diagnosis not present

## 2019-06-04 DIAGNOSIS — F900 Attention-deficit hyperactivity disorder, predominantly inattentive type: Secondary | ICD-10-CM | POA: Diagnosis not present

## 2019-06-04 DIAGNOSIS — J3089 Other allergic rhinitis: Secondary | ICD-10-CM | POA: Diagnosis not present

## 2019-06-18 DIAGNOSIS — Z20828 Contact with and (suspected) exposure to other viral communicable diseases: Secondary | ICD-10-CM | POA: Diagnosis not present

## 2019-09-11 DIAGNOSIS — Z0000 Encounter for general adult medical examination without abnormal findings: Secondary | ICD-10-CM | POA: Diagnosis not present

## 2019-09-11 DIAGNOSIS — Z13220 Encounter for screening for lipoid disorders: Secondary | ICD-10-CM | POA: Diagnosis not present

## 2019-09-11 DIAGNOSIS — F900 Attention-deficit hyperactivity disorder, predominantly inattentive type: Secondary | ICD-10-CM | POA: Diagnosis not present

## 2019-09-11 DIAGNOSIS — J4530 Mild persistent asthma, uncomplicated: Secondary | ICD-10-CM | POA: Diagnosis not present

## 2019-09-11 DIAGNOSIS — Z23 Encounter for immunization: Secondary | ICD-10-CM | POA: Diagnosis not present

## 2019-10-18 ENCOUNTER — Other Ambulatory Visit: Payer: Self-pay

## 2019-10-18 DIAGNOSIS — Z20822 Contact with and (suspected) exposure to covid-19: Secondary | ICD-10-CM

## 2019-10-19 LAB — NOVEL CORONAVIRUS, NAA: SARS-CoV-2, NAA: NOT DETECTED

## 2019-11-02 DIAGNOSIS — J3489 Other specified disorders of nose and nasal sinuses: Secondary | ICD-10-CM | POA: Diagnosis not present

## 2019-11-02 DIAGNOSIS — Z20828 Contact with and (suspected) exposure to other viral communicable diseases: Secondary | ICD-10-CM | POA: Diagnosis not present

## 2019-11-19 DIAGNOSIS — L812 Freckles: Secondary | ICD-10-CM | POA: Diagnosis not present

## 2019-11-19 DIAGNOSIS — Z85828 Personal history of other malignant neoplasm of skin: Secondary | ICD-10-CM | POA: Diagnosis not present

## 2019-11-19 DIAGNOSIS — D225 Melanocytic nevi of trunk: Secondary | ICD-10-CM | POA: Diagnosis not present

## 2019-11-19 DIAGNOSIS — L308 Other specified dermatitis: Secondary | ICD-10-CM | POA: Diagnosis not present

## 2019-12-05 ENCOUNTER — Ambulatory Visit: Payer: BC Managed Care – PPO | Attending: Internal Medicine

## 2019-12-05 DIAGNOSIS — Z20822 Contact with and (suspected) exposure to covid-19: Secondary | ICD-10-CM

## 2019-12-05 DIAGNOSIS — Z20828 Contact with and (suspected) exposure to other viral communicable diseases: Secondary | ICD-10-CM | POA: Diagnosis not present

## 2019-12-07 LAB — NOVEL CORONAVIRUS, NAA: SARS-CoV-2, NAA: NOT DETECTED

## 2019-12-17 DIAGNOSIS — M542 Cervicalgia: Secondary | ICD-10-CM | POA: Diagnosis not present

## 2020-01-16 DIAGNOSIS — M5412 Radiculopathy, cervical region: Secondary | ICD-10-CM | POA: Diagnosis not present

## 2020-01-16 DIAGNOSIS — M25512 Pain in left shoulder: Secondary | ICD-10-CM | POA: Diagnosis not present

## 2020-01-24 DIAGNOSIS — M47812 Spondylosis without myelopathy or radiculopathy, cervical region: Secondary | ICD-10-CM | POA: Diagnosis not present

## 2020-01-24 DIAGNOSIS — M25512 Pain in left shoulder: Secondary | ICD-10-CM | POA: Diagnosis not present

## 2020-01-28 DIAGNOSIS — M542 Cervicalgia: Secondary | ICD-10-CM | POA: Diagnosis not present

## 2020-02-04 DIAGNOSIS — M50323 Other cervical disc degeneration at C6-C7 level: Secondary | ICD-10-CM | POA: Diagnosis not present

## 2020-02-04 DIAGNOSIS — M542 Cervicalgia: Secondary | ICD-10-CM | POA: Diagnosis not present

## 2020-02-04 DIAGNOSIS — M5412 Radiculopathy, cervical region: Secondary | ICD-10-CM | POA: Diagnosis not present

## 2020-02-11 DIAGNOSIS — M542 Cervicalgia: Secondary | ICD-10-CM | POA: Diagnosis not present

## 2020-02-11 DIAGNOSIS — M50323 Other cervical disc degeneration at C6-C7 level: Secondary | ICD-10-CM | POA: Diagnosis not present

## 2020-02-11 DIAGNOSIS — M5412 Radiculopathy, cervical region: Secondary | ICD-10-CM | POA: Diagnosis not present

## 2020-02-17 DIAGNOSIS — Z23 Encounter for immunization: Secondary | ICD-10-CM | POA: Diagnosis not present

## 2020-02-21 DIAGNOSIS — M5412 Radiculopathy, cervical region: Secondary | ICD-10-CM | POA: Diagnosis not present

## 2020-02-21 DIAGNOSIS — M542 Cervicalgia: Secondary | ICD-10-CM | POA: Diagnosis not present

## 2020-02-21 DIAGNOSIS — M50323 Other cervical disc degeneration at C6-C7 level: Secondary | ICD-10-CM | POA: Diagnosis not present

## 2020-03-03 DIAGNOSIS — M542 Cervicalgia: Secondary | ICD-10-CM | POA: Diagnosis not present

## 2020-03-03 DIAGNOSIS — M5412 Radiculopathy, cervical region: Secondary | ICD-10-CM | POA: Diagnosis not present

## 2020-03-03 DIAGNOSIS — M50323 Other cervical disc degeneration at C6-C7 level: Secondary | ICD-10-CM | POA: Diagnosis not present

## 2020-03-09 DIAGNOSIS — Z23 Encounter for immunization: Secondary | ICD-10-CM | POA: Diagnosis not present

## 2020-03-12 DIAGNOSIS — M542 Cervicalgia: Secondary | ICD-10-CM | POA: Diagnosis not present

## 2020-03-12 DIAGNOSIS — M50323 Other cervical disc degeneration at C6-C7 level: Secondary | ICD-10-CM | POA: Diagnosis not present

## 2020-03-12 DIAGNOSIS — M5412 Radiculopathy, cervical region: Secondary | ICD-10-CM | POA: Diagnosis not present

## 2020-03-13 DIAGNOSIS — F900 Attention-deficit hyperactivity disorder, predominantly inattentive type: Secondary | ICD-10-CM | POA: Diagnosis not present

## 2020-03-13 DIAGNOSIS — J4530 Mild persistent asthma, uncomplicated: Secondary | ICD-10-CM | POA: Diagnosis not present

## 2020-03-19 DIAGNOSIS — M5412 Radiculopathy, cervical region: Secondary | ICD-10-CM | POA: Diagnosis not present

## 2020-03-19 DIAGNOSIS — M50323 Other cervical disc degeneration at C6-C7 level: Secondary | ICD-10-CM | POA: Diagnosis not present

## 2020-03-19 DIAGNOSIS — M542 Cervicalgia: Secondary | ICD-10-CM | POA: Diagnosis not present

## 2020-03-24 DIAGNOSIS — M542 Cervicalgia: Secondary | ICD-10-CM | POA: Diagnosis not present

## 2020-03-24 DIAGNOSIS — M50323 Other cervical disc degeneration at C6-C7 level: Secondary | ICD-10-CM | POA: Diagnosis not present

## 2020-03-24 DIAGNOSIS — M5412 Radiculopathy, cervical region: Secondary | ICD-10-CM | POA: Diagnosis not present

## 2020-04-17 DIAGNOSIS — M50122 Cervical disc disorder at C5-C6 level with radiculopathy: Secondary | ICD-10-CM | POA: Diagnosis not present

## 2020-04-17 DIAGNOSIS — M5384 Other specified dorsopathies, thoracic region: Secondary | ICD-10-CM | POA: Diagnosis not present

## 2020-04-17 DIAGNOSIS — M9901 Segmental and somatic dysfunction of cervical region: Secondary | ICD-10-CM | POA: Diagnosis not present

## 2020-04-17 DIAGNOSIS — M9902 Segmental and somatic dysfunction of thoracic region: Secondary | ICD-10-CM | POA: Diagnosis not present

## 2020-05-05 DIAGNOSIS — M47816 Spondylosis without myelopathy or radiculopathy, lumbar region: Secondary | ICD-10-CM | POA: Diagnosis not present

## 2020-08-13 DIAGNOSIS — Z23 Encounter for immunization: Secondary | ICD-10-CM | POA: Diagnosis not present

## 2020-08-21 DIAGNOSIS — F900 Attention-deficit hyperactivity disorder, predominantly inattentive type: Secondary | ICD-10-CM | POA: Diagnosis not present

## 2020-08-21 DIAGNOSIS — J4530 Mild persistent asthma, uncomplicated: Secondary | ICD-10-CM | POA: Diagnosis not present

## 2020-12-30 DIAGNOSIS — Z1159 Encounter for screening for other viral diseases: Secondary | ICD-10-CM | POA: Diagnosis not present

## 2021-01-12 DIAGNOSIS — Z1159 Encounter for screening for other viral diseases: Secondary | ICD-10-CM | POA: Diagnosis not present

## 2021-02-03 DIAGNOSIS — Z1159 Encounter for screening for other viral diseases: Secondary | ICD-10-CM | POA: Diagnosis not present

## 2021-02-25 DIAGNOSIS — F900 Attention-deficit hyperactivity disorder, predominantly inattentive type: Secondary | ICD-10-CM | POA: Diagnosis not present

## 2021-02-25 DIAGNOSIS — Z23 Encounter for immunization: Secondary | ICD-10-CM | POA: Diagnosis not present

## 2021-02-25 DIAGNOSIS — Z0000 Encounter for general adult medical examination without abnormal findings: Secondary | ICD-10-CM | POA: Diagnosis not present

## 2021-02-25 DIAGNOSIS — J4530 Mild persistent asthma, uncomplicated: Secondary | ICD-10-CM | POA: Diagnosis not present

## 2021-02-25 DIAGNOSIS — Z1329 Encounter for screening for other suspected endocrine disorder: Secondary | ICD-10-CM | POA: Diagnosis not present

## 2021-02-25 DIAGNOSIS — Z13220 Encounter for screening for lipoid disorders: Secondary | ICD-10-CM | POA: Diagnosis not present

## 2021-02-25 DIAGNOSIS — F4010 Social phobia, unspecified: Secondary | ICD-10-CM | POA: Diagnosis not present

## 2021-04-15 DIAGNOSIS — D225 Melanocytic nevi of trunk: Secondary | ICD-10-CM | POA: Diagnosis not present

## 2021-04-15 DIAGNOSIS — L812 Freckles: Secondary | ICD-10-CM | POA: Diagnosis not present

## 2021-04-15 DIAGNOSIS — D1801 Hemangioma of skin and subcutaneous tissue: Secondary | ICD-10-CM | POA: Diagnosis not present

## 2021-05-01 DIAGNOSIS — U071 COVID-19: Secondary | ICD-10-CM | POA: Diagnosis not present

## 2021-05-01 DIAGNOSIS — Z1159 Encounter for screening for other viral diseases: Secondary | ICD-10-CM | POA: Diagnosis not present

## 2021-09-03 DIAGNOSIS — J4530 Mild persistent asthma, uncomplicated: Secondary | ICD-10-CM | POA: Diagnosis not present

## 2021-09-03 DIAGNOSIS — F900 Attention-deficit hyperactivity disorder, predominantly inattentive type: Secondary | ICD-10-CM | POA: Diagnosis not present

## 2021-09-03 DIAGNOSIS — R051 Acute cough: Secondary | ICD-10-CM | POA: Diagnosis not present

## 2022-04-05 DIAGNOSIS — Z13220 Encounter for screening for lipoid disorders: Secondary | ICD-10-CM | POA: Diagnosis not present

## 2022-04-05 DIAGNOSIS — F900 Attention-deficit hyperactivity disorder, predominantly inattentive type: Secondary | ICD-10-CM | POA: Diagnosis not present

## 2022-04-05 DIAGNOSIS — Z1329 Encounter for screening for other suspected endocrine disorder: Secondary | ICD-10-CM | POA: Diagnosis not present

## 2022-04-05 DIAGNOSIS — J4530 Mild persistent asthma, uncomplicated: Secondary | ICD-10-CM | POA: Diagnosis not present

## 2022-04-05 DIAGNOSIS — J301 Allergic rhinitis due to pollen: Secondary | ICD-10-CM | POA: Diagnosis not present

## 2022-04-05 DIAGNOSIS — Z0000 Encounter for general adult medical examination without abnormal findings: Secondary | ICD-10-CM | POA: Diagnosis not present

## 2022-04-21 DIAGNOSIS — D2261 Melanocytic nevi of right upper limb, including shoulder: Secondary | ICD-10-CM | POA: Diagnosis not present

## 2022-04-21 DIAGNOSIS — D1801 Hemangioma of skin and subcutaneous tissue: Secondary | ICD-10-CM | POA: Diagnosis not present

## 2022-04-21 DIAGNOSIS — D2262 Melanocytic nevi of left upper limb, including shoulder: Secondary | ICD-10-CM | POA: Diagnosis not present

## 2022-04-21 DIAGNOSIS — D225 Melanocytic nevi of trunk: Secondary | ICD-10-CM | POA: Diagnosis not present

## 2022-10-01 DIAGNOSIS — R059 Cough, unspecified: Secondary | ICD-10-CM | POA: Diagnosis not present

## 2022-10-01 DIAGNOSIS — Z03818 Encounter for observation for suspected exposure to other biological agents ruled out: Secondary | ICD-10-CM | POA: Diagnosis not present

## 2022-10-06 DIAGNOSIS — J4530 Mild persistent asthma, uncomplicated: Secondary | ICD-10-CM | POA: Diagnosis not present

## 2022-10-06 DIAGNOSIS — F900 Attention-deficit hyperactivity disorder, predominantly inattentive type: Secondary | ICD-10-CM | POA: Diagnosis not present

## 2022-10-06 DIAGNOSIS — J301 Allergic rhinitis due to pollen: Secondary | ICD-10-CM | POA: Diagnosis not present

## 2022-12-31 DIAGNOSIS — J111 Influenza due to unidentified influenza virus with other respiratory manifestations: Secondary | ICD-10-CM | POA: Diagnosis not present

## 2022-12-31 DIAGNOSIS — R110 Nausea: Secondary | ICD-10-CM | POA: Diagnosis not present

## 2022-12-31 DIAGNOSIS — Z7184 Encounter for health counseling related to travel: Secondary | ICD-10-CM | POA: Diagnosis not present

## 2023-04-15 DIAGNOSIS — Z0000 Encounter for general adult medical examination without abnormal findings: Secondary | ICD-10-CM | POA: Diagnosis not present

## 2023-04-15 DIAGNOSIS — Z1329 Encounter for screening for other suspected endocrine disorder: Secondary | ICD-10-CM | POA: Diagnosis not present

## 2023-04-15 DIAGNOSIS — J4530 Mild persistent asthma, uncomplicated: Secondary | ICD-10-CM | POA: Diagnosis not present

## 2023-04-15 DIAGNOSIS — Z131 Encounter for screening for diabetes mellitus: Secondary | ICD-10-CM | POA: Diagnosis not present

## 2023-04-15 DIAGNOSIS — J301 Allergic rhinitis due to pollen: Secondary | ICD-10-CM | POA: Diagnosis not present

## 2023-04-15 DIAGNOSIS — Z125 Encounter for screening for malignant neoplasm of prostate: Secondary | ICD-10-CM | POA: Diagnosis not present

## 2023-04-15 DIAGNOSIS — F900 Attention-deficit hyperactivity disorder, predominantly inattentive type: Secondary | ICD-10-CM | POA: Diagnosis not present

## 2023-04-15 DIAGNOSIS — Z13220 Encounter for screening for lipoid disorders: Secondary | ICD-10-CM | POA: Diagnosis not present

## 2023-04-15 DIAGNOSIS — I7300 Raynaud's syndrome without gangrene: Secondary | ICD-10-CM | POA: Diagnosis not present

## 2023-06-08 DIAGNOSIS — L812 Freckles: Secondary | ICD-10-CM | POA: Diagnosis not present

## 2023-06-08 DIAGNOSIS — D225 Melanocytic nevi of trunk: Secondary | ICD-10-CM | POA: Diagnosis not present

## 2023-06-08 DIAGNOSIS — L821 Other seborrheic keratosis: Secondary | ICD-10-CM | POA: Diagnosis not present

## 2023-06-08 DIAGNOSIS — Z85828 Personal history of other malignant neoplasm of skin: Secondary | ICD-10-CM | POA: Diagnosis not present

## 2023-10-21 DIAGNOSIS — J4530 Mild persistent asthma, uncomplicated: Secondary | ICD-10-CM | POA: Diagnosis not present

## 2023-10-21 DIAGNOSIS — F900 Attention-deficit hyperactivity disorder, predominantly inattentive type: Secondary | ICD-10-CM | POA: Diagnosis not present

## 2024-03-11 DIAGNOSIS — S81011A Laceration without foreign body, right knee, initial encounter: Secondary | ICD-10-CM | POA: Diagnosis not present

## 2024-03-11 DIAGNOSIS — W25XXXA Contact with sharp glass, initial encounter: Secondary | ICD-10-CM | POA: Diagnosis not present

## 2024-05-09 DIAGNOSIS — J4530 Mild persistent asthma, uncomplicated: Secondary | ICD-10-CM | POA: Diagnosis not present

## 2024-05-09 DIAGNOSIS — F900 Attention-deficit hyperactivity disorder, predominantly inattentive type: Secondary | ICD-10-CM | POA: Diagnosis not present

## 2024-05-09 DIAGNOSIS — J301 Allergic rhinitis due to pollen: Secondary | ICD-10-CM | POA: Diagnosis not present

## 2024-05-09 DIAGNOSIS — Z0000 Encounter for general adult medical examination without abnormal findings: Secondary | ICD-10-CM | POA: Diagnosis not present

## 2024-06-11 DIAGNOSIS — D72819 Decreased white blood cell count, unspecified: Secondary | ICD-10-CM | POA: Diagnosis not present

## 2024-06-27 DIAGNOSIS — L812 Freckles: Secondary | ICD-10-CM | POA: Diagnosis not present

## 2024-06-27 DIAGNOSIS — D225 Melanocytic nevi of trunk: Secondary | ICD-10-CM | POA: Diagnosis not present
